# Patient Record
Sex: Male | Born: 1955 | Race: White | Hispanic: No | Smoking: Former smoker
Health system: Southern US, Community
[De-identification: ages and names within clinical notes are randomized; demographics above are authoritative.]

## PROBLEM LIST (undated history)

## (undated) DIAGNOSIS — F39 Unspecified mood [affective] disorder: Secondary | ICD-10-CM

## (undated) DIAGNOSIS — R011 Cardiac murmur, unspecified: Secondary | ICD-10-CM

## (undated) DIAGNOSIS — F0280 Dementia in other diseases classified elsewhere without behavioral disturbance: Secondary | ICD-10-CM

## (undated) DIAGNOSIS — E039 Hypothyroidism, unspecified: Secondary | ICD-10-CM

## (undated) DIAGNOSIS — G3109 Other frontotemporal dementia: Secondary | ICD-10-CM

## (undated) HISTORY — DX: Unspecified mood (affective) disorder: F39

## (undated) HISTORY — DX: Cardiac murmur, unspecified: R01.1

## (undated) HISTORY — DX: Hypothyroidism, unspecified: E03.9

## (undated) HISTORY — DX: Dementia in other diseases classified elsewhere, unspecified severity, without behavioral disturbance, psychotic disturbance, mood disturbance, and anxiety: F02.80

## (undated) HISTORY — DX: Other frontotemporal dementia: G31.09

---

## 2000-11-19 ENCOUNTER — Encounter: Payer: Self-pay | Admitting: *Deleted

## 2000-11-19 ENCOUNTER — Encounter: Admission: RE | Admit: 2000-11-19 | Discharge: 2000-11-19 | Payer: Self-pay | Admitting: *Deleted

## 2000-12-06 ENCOUNTER — Encounter: Payer: Self-pay | Admitting: *Deleted

## 2000-12-06 ENCOUNTER — Encounter: Admission: RE | Admit: 2000-12-06 | Discharge: 2000-12-06 | Payer: Self-pay | Admitting: *Deleted

## 2005-09-27 ENCOUNTER — Emergency Department: Payer: Self-pay | Admitting: Emergency Medicine

## 2012-06-19 ENCOUNTER — Ambulatory Visit: Payer: Self-pay | Admitting: Adult Health

## 2013-09-21 ENCOUNTER — Ambulatory Visit: Payer: Self-pay | Admitting: Family Medicine

## 2013-09-26 ENCOUNTER — Ambulatory Visit (INDEPENDENT_AMBULATORY_CARE_PROVIDER_SITE_OTHER): Payer: Medicaid Other | Admitting: Cardiovascular Disease

## 2013-09-26 ENCOUNTER — Encounter: Payer: Self-pay | Admitting: Cardiovascular Disease

## 2013-09-26 VITALS — BP 132/92 | HR 65 | Ht 68.0 in | Wt 170.5 lb

## 2013-09-26 DIAGNOSIS — F0280 Dementia in other diseases classified elsewhere without behavioral disturbance: Secondary | ICD-10-CM | POA: Insufficient documentation

## 2013-09-26 DIAGNOSIS — R011 Cardiac murmur, unspecified: Secondary | ICD-10-CM | POA: Insufficient documentation

## 2013-09-26 DIAGNOSIS — E785 Hyperlipidemia, unspecified: Secondary | ICD-10-CM | POA: Insufficient documentation

## 2013-09-26 DIAGNOSIS — E039 Hypothyroidism, unspecified: Secondary | ICD-10-CM | POA: Insufficient documentation

## 2013-09-26 MED ORDER — PRAVASTATIN SODIUM 20 MG PO TABS: 20.0000 mg | ORAL_TABLET | Freq: Every evening | ORAL | Status: AC

## 2013-09-26 NOTE — Assessment & Plan Note (Signed)
Recent progression, followed at [facility] neurology

## 2013-09-26 NOTE — Assessment & Plan Note (Signed)
On clinical exam today, possible innocent flow murmur, I/VI. We did offer echocardiogram. Discussed this with the family and they prefer that if it is not additionally clinically significant, that they hold off on further testing. We have suggested if he has progression of the intensity of his murmur, worsening shortness of breath or edema or new cardiac symptoms, but they call he office and we will perform an echocardiogram.

## 2013-09-26 NOTE — Progress Notes (Signed)
   Patient ID: Luke Mason, male    DOB: 10-Dec-1955, 57 y.o.   MRN: [id number]  HPI Comments: [name] is a very pleasant 57 year old gentleman, patient of Dr. Marguerite Olea, who presents by referral for heart murmur. Notes provided a primary care detail a history of frontotemporal lobe dementia, depression. He is followed at [facility] neurology. He can no longer live alone. History of hypothyroidism.  He reports that he is very active, goes to the gym and works out on a regular basis. He denies any shortness of breath or chest pain with exertion. No lower extremity edema, no lightheadedness or near syncope.  He does report a significant family history of heart disease. Father died at age 67 from heart attack, grandfather died at age 16 from heart attack.  He does have a smoking history, smoked for 25 years, quit 5-6 years ago. He smoked one to 2 packs of cigarettes per day.  EKG shows normal sinus rhythm with rate 65 beats per minute, no significant ST or T wave changes     Outpatient Encounter Prescriptions as of 09/26/2013  Medication Sig Dispense Refill  . aspirin 81 MG tablet Take 81 mg by mouth daily.      . cholecalciferol (VITAMIN D) 1000 UNITS tablet Take 1,000 Units by mouth daily.      Marland Kitchen levothyroxine (SYNTHROID, LEVOTHROID) 25 MCG tablet Take 25 mcg by mouth daily before breakfast.      . Multiple Vitamin (MULTIVITAMIN) tablet Take 1 tablet by mouth daily.      . sertraline (ZOLOFT) 100 MG tablet Take 100 mg by mouth daily.         Review of Systems  Constitutional: Negative.   HENT: Negative.   Eyes: Negative.   Respiratory: Negative.   Cardiovascular: Negative.   Gastrointestinal: Negative.   Endocrine: Negative.   Musculoskeletal: Negative.   Skin: Negative.   Allergic/Immunologic: Negative.   Neurological: Negative.   Hematological: Negative.   Psychiatric/Behavioral: Negative.   All other systems reviewed and are negative.    BP 132/92  Pulse 65  Ht 5\' 8"  (1.727  m)  Wt 170 lb 8 oz (77.338 kg)  BMI 25.93 kg/m2  Physical Exam  Nursing note and vitals reviewed. Constitutional: He is oriented to person, place, and time. He appears well-developed and well-nourished.  HENT:  Head: Normocephalic.  Nose: Nose normal.  Mouth/Throat: Oropharynx is clear and moist.  Eyes: Conjunctivae are normal. Pupils are equal, round, and reactive to light.  Neck: Normal range of motion. Neck supple. No JVD present.  Cardiovascular: Normal rate, regular rhythm, S1 normal, S2 normal, normal heart sounds and intact distal pulses.  Exam reveals no gallop and no friction rub.   No murmur heard. Pulmonary/Chest: Effort normal and breath sounds normal. No respiratory distress. He has no wheezes. He has no rales. He exhibits no tenderness.  Abdominal: Soft. Bowel sounds are normal. He exhibits no distension. There is no tenderness.  Musculoskeletal: Normal range of motion. He exhibits no edema and no tenderness.  Lymphadenopathy:    He has no cervical adenopathy.  Neurological: He is alert and oriented to person, place, and time. Coordination normal.  Skin: Skin is warm and dry. No rash noted. No erythema.  Psychiatric: He has a normal mood and affect. His behavior is normal. Judgment and thought content normal.      Assessment and Plan

## 2013-09-26 NOTE — Assessment & Plan Note (Signed)
Currently on thyroid supplementation medication

## 2013-09-26 NOTE — Patient Instructions (Signed)
You are doing well. Please start pravastatin 1/2 a pill once a day for a few weeks If you have no side effects, increase to a  full pill  Please call us if you have new issues that need to be addressed before your next appt.  Your physician wants you to follow-up in:12 months.

## 2013-09-26 NOTE — Assessment & Plan Note (Signed)
We spent significant time talking about cholesterol. He does have a significant smoking history, very strong family history. In discussion with the patient and his family, they would prefer to treat this aggressively. We will start low-dose pravastatin 10 mg for several weeks titrating up to 20 mg.

## 2014-08-22 ENCOUNTER — Telehealth: Payer: Self-pay | Admitting: *Deleted

## 2014-08-22 NOTE — Telephone Encounter (Signed)
Pt's sister calling because she got your name from her support group of patient's that you have already treated with FTD(frontal lobe dementia?) Luke Mason & [name] you treated before, sister would like to switch pt to you because of their recommendations. Sister states pt has BCBS and that he's declining. Pt currently seeing Dr. Marguerite Olea and [facility] neurology, she would like to know your thoughts and if you would accept her brother as a patient, she has HCPOA. Please advise

## 2014-08-24 NOTE — Telephone Encounter (Signed)
Message left Will try back next week

## 2014-08-27 NOTE — Telephone Encounter (Signed)
Message left again Will print her info and try to reach her intermittently Told her to leave me another number if that would help

## 2014-09-03 ENCOUNTER — Encounter: Payer: Self-pay | Admitting: Internal Medicine

## 2014-09-03 ENCOUNTER — Ambulatory Visit (INDEPENDENT_AMBULATORY_CARE_PROVIDER_SITE_OTHER): Payer: BLUE CROSS/BLUE SHIELD | Admitting: Internal Medicine

## 2014-09-03 VITALS — BP 138/82 | HR 63 | Temp 98.7°F | Resp 14 | Wt 179.8 lb

## 2014-09-03 DIAGNOSIS — G3109 Other frontotemporal dementia: Secondary | ICD-10-CM

## 2014-09-03 DIAGNOSIS — E038 Other specified hypothyroidism: Secondary | ICD-10-CM

## 2014-09-03 DIAGNOSIS — E785 Hyperlipidemia, unspecified: Secondary | ICD-10-CM

## 2014-09-03 DIAGNOSIS — F39 Unspecified mood [affective] disorder: Secondary | ICD-10-CM

## 2014-09-03 DIAGNOSIS — F0280 Dementia in other diseases classified elsewhere without behavioral disturbance: Secondary | ICD-10-CM

## 2014-09-03 NOTE — Assessment & Plan Note (Signed)
Rx not appropriate Will get baseline levels

## 2014-09-03 NOTE — Progress Notes (Signed)
Pre visit review using our clinic review tool, if applicable. No additional management support is needed unless otherwise documented below in the visit note. 

## 2014-09-03 NOTE — Assessment & Plan Note (Signed)
Moderate Needs help with ADLs Has adult day care and sister has some respite---may have issues as he worsens

## 2014-09-03 NOTE — Assessment & Plan Note (Signed)
Doing okay for now on the sertraline No changes

## 2014-09-03 NOTE — Assessment & Plan Note (Signed)
On replacement Due for labs

## 2014-09-03 NOTE — Progress Notes (Signed)
Subjective:    Patient ID: Luke Mason, male    DOB: 01-25-1956, 58 y.o.   MRN: [id number]  HPI Here with sister---he lives with her He has lived with her for 3 years Establishing here  Has frontal lobe dementia [facility]-- first diagnosed 9/13 He doesn't notice any problems No particular anger issues Needs cueing for all ADLs Wears diaper for incontinence No elopement issues [facility] while sister is working She gets occasional respite from aunt who will watch him for a weekend (while she visits grandkids in [place])  Does have some depressed mood Still gets emotional but this seems to help He states he is satisfied  Has been on low dose thyroid med At least 3 years  History of high cholesterol Never been on meds for this  Current Outpatient Prescriptions on File Prior to Visit  Medication Sig Dispense Refill  . aspirin 81 MG tablet Take 81 mg by mouth daily.      Marland Kitchen. levothyroxine (SYNTHROID, LEVOTHROID) 25 MCG tablet Take 25 mcg by mouth daily before breakfast.      . Multiple Vitamin (MULTIVITAMIN) tablet Take 1 tablet by mouth daily.      . sertraline (ZOLOFT) 100 MG tablet Take 100 mg by mouth daily.      . cholecalciferol (VITAMIN D) 1000 UNITS tablet Take 1,000 Units by mouth daily.      . pravastatin (PRAVACHOL) 20 MG tablet Take 1 tablet (20 mg total) by mouth every evening.  90 tablet  3   No current facility-administered medications on file prior to visit.    No Known Allergies  Past Medical History  Diagnosis Date  . Unspecified hypothyroidism   . Episodic mood disorder   . Heart murmur   . Dementia, frontotemporal     lobe    No past surgical history on file.  Family History  Problem Relation Age of Onset  . Heart disease Father   . Hypertension Father   . Hyperlipidemia Father   . Heart attack Father     History   Social History  . Marital Status: Divorced    Spouse Name: N/A    Number of Children: 0   . Years of Education: N/A   Occupational History  . Systems developernsurance sales--- disabled    Social History Main Topics  . Smoking status: Former Smoker    Types: Cigarettes    Quit date: 09/26/2007  . Smokeless tobacco: Not on file  . Alcohol Use: Yes     Comment: occasional beer.  . Drug Use: No  . Sexual Activity: Not on file   Other Topics Concern  . Not on file   Social History Narrative   Lives with sister since 2012      No living will    Requests sister to be health care POA   Would accept resuscitation   Review of Systems  Constitutional: Negative for fatigue and unexpected weight change.  HENT: Negative for dental problem and hearing loss.   Eyes: Negative for visual disturbance.       Reading glasses  Respiratory: Negative for cough, chest tightness and shortness of breath.   Cardiovascular: Negative for chest pain, palpitations and leg swelling.  Gastrointestinal: Negative for nausea, abdominal pain, diarrhea and constipation.  Genitourinary: Negative for dysuria, urgency and difficulty urinating.  Musculoskeletal: Negative for arthralgias, back pain and joint swelling.  Skin: Negative for rash.  Neurological: Positive for headaches. Negative for dizziness, syncope and  light-headedness.       Occasional frontal headaches-- tylenol usually helps  Psychiatric/Behavioral: Positive for dysphoric mood. Negative for sleep disturbance. The patient is nervous/anxious.        Objective:   Physical Exam  Constitutional: He appears well-developed and well-nourished. No distress.  Neck: Normal range of motion. Neck supple. No thyromegaly present.  Cardiovascular: Normal rate, regular rhythm, normal heart sounds and intact distal pulses.  Exam reveals no gallop.   No murmur heard. Pulmonary/Chest: Effort normal and breath sounds normal. No respiratory distress. He has no wheezes. He has no rales.  Abdominal: Soft. There is no tenderness.  Musculoskeletal: He exhibits no edema  and no tenderness.  Lymphadenopathy:    He has no cervical adenopathy.  Neurological: He exhibits normal muscle tone. Coordination normal.  Doesn't understand directions (couldn't figure out what I meant by "take off your socks" Answered "no" to all ROS   Skin: No rash noted.  Psychiatric: He has a normal mood and affect. His behavior is normal.          Assessment & Plan:

## 2014-09-04 LAB — CBC WITH DIFFERENTIAL/PLATELET
BASOS ABS: 0.1 10*3/uL (ref 0.0–0.1)
Basophils Relative: 0.9 % (ref 0.0–3.0)
Eosinophils Absolute: 0.1 10*3/uL (ref 0.0–0.7)
Eosinophils Relative: 1 % (ref 0.0–5.0)
HCT: 41.1 % (ref 39.0–52.0)
Hemoglobin: 14.2 g/dL (ref 13.0–17.0)
LYMPHS PCT: 22.2 % (ref 12.0–46.0)
Lymphs Abs: 1.6 10*3/uL (ref 0.7–4.0)
MCHC: 34.6 g/dL (ref 30.0–36.0)
MCV: 95.4 fl (ref 78.0–100.0)
MONOS PCT: 8.9 % (ref 3.0–12.0)
Monocytes Absolute: 0.6 10*3/uL (ref 0.1–1.0)
Neutro Abs: 4.8 10*3/uL (ref 1.4–7.7)
Neutrophils Relative %: 67 % (ref 43.0–77.0)
Platelets: 241 10*3/uL (ref 150.0–400.0)
RBC: 4.31 Mil/uL (ref 4.22–5.81)
RDW: 12.6 % (ref 11.5–15.5)
WBC: 7.2 10*3/uL (ref 4.0–10.5)

## 2014-09-04 LAB — COMPREHENSIVE METABOLIC PANEL
ALT: 11 U/L (ref 0–53)
AST: 19 U/L (ref 0–37)
Albumin: 4.5 g/dL (ref 3.5–5.2)
Alkaline Phosphatase: 56 U/L (ref 39–117)
BUN: 11 mg/dL (ref 6–23)
CALCIUM: 9.6 mg/dL (ref 8.4–10.5)
CHLORIDE: 102 meq/L (ref 96–112)
CO2: 31 meq/L (ref 19–32)
CREATININE: 1 mg/dL (ref 0.4–1.5)
GFR: 84.23 mL/min (ref >60.00)
Glucose, Bld: 88 mg/dL (ref 70–99)
Potassium: 4.5 mEq/L (ref 3.5–5.1)
Sodium: 139 mEq/L (ref 135–145)
Total Bilirubin: 0.6 mg/dL (ref 0.2–1.2)
Total Protein: 7.5 g/dL (ref 6.0–8.3)

## 2014-09-04 LAB — LIPID PANEL
CHOL/HDL RATIO: 5
CHOLESTEROL: 216 mg/dL — AB (ref 0–200)
HDL: 44.8 mg/dL (ref >39.00)
LDL Cholesterol: 136 mg/dL — ABNORMAL HIGH (ref 0–99)
NonHDL: 171.2
TRIGLYCERIDES: 176 mg/dL — AB (ref 0.0–149.0)
VLDL: 35.2 mg/dL (ref 0.0–40.0)

## 2014-09-04 LAB — TSH: TSH: 2.46 u[IU]/mL (ref 0.35–4.50)

## 2014-09-04 LAB — T4, FREE: Free T4: 0.85 ng/dL (ref 0.60–1.60)

## 2015-02-25 ENCOUNTER — Telehealth: Payer: Self-pay | Admitting: Internal Medicine

## 2015-02-25 ENCOUNTER — Encounter: Payer: Self-pay | Admitting: Internal Medicine

## 2015-02-25 NOTE — Telephone Encounter (Signed)
Patient Name: Luke Mason DOB: 01-30-1956 Initial Comment Caller states brother has shortness of breath and really hot and flushed/. Nurse Assessment Nurse: Charna Elizabethrumbull, RN, Cathy Date/Time (Eastern Time): 02/25/2015 2:44:19 PM Confirm and document reason for call. If symptomatic, describe symptoms. ---Caller states her brother has shortness of breath today. No blueness around his lips. Has the patient traveled out of the country within the last 30 days? ---No Does the patient require triage? ---Yes Related visit to physician within the last 2 weeks? ---No Does the PT have any chronic conditions? (i.e. diabetes, asthma, etc.) ---Yes List chronic conditions. ---Dementia Guidelines Guideline Title Affirmed Question Affirmed Notes Breathing Difficulty [1] MODERATE difficulty breathing (e.g., speaks in phrases, SOB even at rest, pulse 100-120) AND [2] NEW-onset or WORSE than normal Final Disposition User Go to ED Now Charna Elizabethrumbull, RN, Lynden AngCathy They plan to go [facility].

## 2015-02-25 NOTE — Telephone Encounter (Signed)
Spoke to sister Not sure about the blood sugar notes--that is not correct  Obviously communication with him can be difficult Has some vague SOB She doesn't feel it warrants ER eval  Will see tomorrow  Lyla SonCarrie, Please add him on at 12:15PM Tuesday (3/29)

## 2015-02-25 NOTE — Telephone Encounter (Signed)
PLEASE NOTE: All timestamps contained within this report are represented as Eastern Standard Time. CONFIDENTIALTY NOTICE: This fax transmission is intended only for the addressee. It contains information that is legally privileged, confidential or otherwise protected from use or disclosure. If you are not the intended recipient, you are strictly prohibited from reviewing, disclosing, copying using or disseminating any of this information or taking any action in reliance on or regarding this information. If you have received this fax in error, please notify us immediately by telephone so that we can arrange for its return to us. Phone: [phone], Toll-Free: [phone], Fax: [phone] Page: 1 of 2 Call Id: [id number] [facility] - Client TELEPHONE ADVICE RECORD [facility] Patient Name: Luke Mason Gender: Male DOB: 08/16/1926 Age: 59 Y 6 M 11 D Return Phone Number: [phone] (Primary) Address: [address] City/State/Zip: [place] [address] Client [facility] - Client Client Site [facility] - Day Physician Bedsole, Amy Contact Type Call Call Type Triage / Clinical Relationship To Patient Self Appointment Disposition EMR Appointment Not Necessary Info pasted into Epic Yes Return Phone Number [phone] (Primary) Chief Complaint Blood Sugar High Initial Comment Caller states BS is 297, fatigue, on metformin; PreDisposition Call Doctor Nurse Assessment Nurse: Jones, RN, Miranda Date/Time (Eastern Time): 02/25/2015 10:02:02 AM Confirm and document reason for call. If symptomatic, describe symptoms. ---Caller states her BS is 297 (normally < 150) and having fatigue. Has the patient traveled out of the country within the last 30 days? ---Not Applicable Does the patient require triage? ---Yes Related visit to physician within the last 2 weeks? ---No Does the PT have any  chronic conditions? (i.e. diabetes, asthma, etc.) ---Yes List chronic conditions. ---Diabetes, HTN, High Cholesterol Guidelines Guideline Title Affirmed Question Affirmed Notes Nurse Date/Time (Eastern Time) Diabetes - High Blood Sugar [1] Blood glucose > 240 mg/dl (13 mmol/l) AND [2] does not use insulin (e.g., not insulindependent; most type 2 diabetics) (all triage questions negative) Jones, RN, Miranda 02/25/2015 10:04:31 AM Disp. Time (Eastern Time) Disposition Final User 02/25/2015 10:08:45 AM Home Care Yes Jones, RN, Miranda Caller Understands: Yes PLEASE NOTE: All timestamps contained within this report are represented as Eastern Standard Time. CONFIDENTIALTY NOTICE: This fax transmission is intended only for the addressee. It contains information that is legally privileged, confidential or otherwise protected from use or disclosure. If you are not the intended recipient, you are strictly prohibited from reviewing, disclosing, copying using or disseminating any of this information or taking any action in reliance on or regarding this information. If you have received this fax in error, please notify us immediately by telephone so that we can arrange for its return to us. Phone: [phone], Toll-Free: [phone], Fax: [phone] Page: 2 of 2 Call Id: [id number] Disagree/Comply: Comply Care Advice Given Per Guideline HOME CARE: You should be able to treat this at home. HIGH BLOOD SUGAR (HYPERGLYCEMIA): TREATMENT - LIQUIDS: * Generally, you should try to drink 6-8 glasses of water each day. * Drink at least one glass (8 oz) of water per hour for the next 4 hours (Reason: adequate hydration will reduce hyperglycemia). TREATMENT - DIABETES MEDICATIONS: Continue taking your diabetes pills.. EXPECTED COURSE - You should CALL YOUR DOCTOR WITHIN 1-3 DAYS if: * Your blood sugar continues to get above 240 mg/dl (13 mmol/l). MEASURE AND RECORD YOUR BLOOD GLUCOSE: * Measure your blood  glucose before breakfast and before going to bed. CALL BACK IF: * Blood   glucose over 300 mg/dL (16.5 mmol/l), two or more times in a row. * Urine ketones become moderate or large * Vomiting lasting over 4 hours or unable to drink any fluids * You become worse or have more questions. CARE ADVICE given per Diabetes - High Blood Sugar (Adult) guideline. * Your blood sugar continues to be higher than the glucose goals your doctor set for you.It has been longer than 6 months since you had an Hemoglobin A1C test. * Record the results and show them to your doctor at your next office visit. After Care Instructions Given Call Event Type User Date / Time Description 

## 2015-02-26 ENCOUNTER — Encounter: Payer: Self-pay | Admitting: Internal Medicine

## 2015-02-26 ENCOUNTER — Ambulatory Visit (INDEPENDENT_AMBULATORY_CARE_PROVIDER_SITE_OTHER): Payer: PPO | Admitting: Internal Medicine

## 2015-02-26 VITALS — BP 132/94 | HR 66 | Temp 98.8°F | Resp 12 | Wt 171.2 lb

## 2015-02-26 DIAGNOSIS — E039 Hypothyroidism, unspecified: Secondary | ICD-10-CM | POA: Diagnosis not present

## 2015-02-26 DIAGNOSIS — G3109 Other frontotemporal dementia: Secondary | ICD-10-CM

## 2015-02-26 DIAGNOSIS — E785 Hyperlipidemia, unspecified: Secondary | ICD-10-CM

## 2015-02-26 DIAGNOSIS — R0609 Other forms of dyspnea: Secondary | ICD-10-CM | POA: Diagnosis not present

## 2015-02-26 DIAGNOSIS — F0280 Dementia in other diseases classified elsewhere without behavioral disturbance: Secondary | ICD-10-CM

## 2015-02-26 LAB — LIPID PANEL
Cholesterol: 199 mg/dL (ref 0–200)
HDL: 44.6 mg/dL (ref >39.00)
LDL Cholesterol: 131 mg/dL — ABNORMAL HIGH (ref 0–99)
NonHDL: 154.4
Total CHOL/HDL Ratio: 4
Triglycerides: 119 mg/dL (ref 0.0–149.0)
VLDL: 23.8 mg/dL (ref 0.0–40.0)

## 2015-02-26 LAB — CBC WITH DIFFERENTIAL/PLATELET
BASOS PCT: 0.3 % (ref 0.0–3.0)
Basophils Absolute: 0 10*3/uL (ref 0.0–0.1)
EOS ABS: 0 10*3/uL (ref 0.0–0.7)
Eosinophils Relative: 0.2 % (ref 0.0–5.0)
HCT: 40.7 % (ref 39.0–52.0)
Hemoglobin: 14 g/dL (ref 13.0–17.0)
LYMPHS ABS: 1.3 10*3/uL (ref 0.7–4.0)
LYMPHS PCT: 16.8 % (ref 12.0–46.0)
MCHC: 34.3 g/dL (ref 30.0–36.0)
MCV: 95 fl (ref 78.0–100.0)
MONOS PCT: 6.4 % (ref 3.0–12.0)
Monocytes Absolute: 0.5 10*3/uL (ref 0.1–1.0)
NEUTROS PCT: 76.3 % (ref 43.0–77.0)
Neutro Abs: 5.8 10*3/uL (ref 1.4–7.7)
Platelets: 216 10*3/uL (ref 150.0–400.0)
RBC: 4.28 Mil/uL (ref 4.22–5.81)
RDW: 12.9 % (ref 11.5–15.5)
WBC: 7.6 10*3/uL (ref 4.0–10.5)

## 2015-02-26 LAB — COMPREHENSIVE METABOLIC PANEL
ALK PHOS: 60 U/L (ref 39–117)
ALT: 14 U/L (ref 0–53)
AST: 14 U/L (ref 0–37)
Albumin: 4.3 g/dL (ref 3.5–5.2)
BILIRUBIN TOTAL: 0.6 mg/dL (ref 0.2–1.2)
BUN: 12 mg/dL (ref 6–23)
CALCIUM: 9.9 mg/dL (ref 8.4–10.5)
CHLORIDE: 101 meq/L (ref 96–112)
CO2: 32 mEq/L (ref 19–32)
Creatinine, Ser: 1.02 mg/dL (ref 0.40–1.50)
GFR: 79.35 mL/min (ref >60.00)
Glucose, Bld: 122 mg/dL — ABNORMAL HIGH (ref 70–99)
Potassium: 3.7 mEq/L (ref 3.5–5.1)
SODIUM: 138 meq/L (ref 135–145)
Total Protein: 7.1 g/dL (ref 6.0–8.3)

## 2015-02-26 LAB — TSH: TSH: 1.57 u[IU]/mL (ref 0.35–4.50)

## 2015-02-26 LAB — T4, FREE: Free T4: 0.82 ng/dL (ref 0.60–1.60)

## 2015-02-26 NOTE — Assessment & Plan Note (Signed)
Will recheck labs given his current symptoms

## 2015-02-26 NOTE — Assessment & Plan Note (Signed)
Continues on the statin May want to stop this over time as his condition progresses

## 2015-02-26 NOTE — Telephone Encounter (Signed)
I added patient on at 12:15.

## 2015-02-26 NOTE — Assessment & Plan Note (Signed)
New finding in past few days No symptoms of infection and exam normal No CHF No clear explanation Will just check labs for now No clear indication that the prolixin could be implicated with this

## 2015-02-26 NOTE — Progress Notes (Signed)
Subjective:    Patient ID: Luke Mason, male    DOB: [birth year], 59 y.o.   MRN: [id number]  HPI Here with sister  He has noticed some SOB with activity--even just walking around house No problems just sitting This started a few days ago No chest pain No apparent dizziness or syncope  No fever Doesn't appear to have respiratory illness  Has been sleeping more Appetite is down some--- but still is okay Sleeps on his side curled up---no orthopnea or PND  Started on prolixin from [facility] [employer] PA at neurology Started about 2 months ago For anger and physical aggression This has helped some Reviewed her last note on Care Everywhere  Current Outpatient Prescriptions on File Prior to Visit  Medication Sig Dispense Refill  . Ascorbic Acid (VITAMIN C) 1000 MG tablet Take 1,000 mg by mouth daily.    Marland Kitchen aspirin 81 MG tablet Take 81 mg by mouth daily.    . cholecalciferol (VITAMIN D) 1000 UNITS tablet Take 1,000 Units by mouth daily.    Marland Kitchen levothyroxine (SYNTHROID, LEVOTHROID) 25 MCG tablet Take 25 mcg by mouth daily before breakfast.    . Multiple Vitamin (MULTIVITAMIN) tablet Take 1 tablet by mouth daily.    . pravastatin (PRAVACHOL) 20 MG tablet Take 1 tablet (20 mg total) by mouth every evening. 90 tablet 3  . sertraline (ZOLOFT) 100 MG tablet Take 100 mg by mouth daily.     No current facility-administered medications on file prior to visit.    No Known Allergies  Past Medical History  Diagnosis Date  . Unspecified hypothyroidism   . Episodic mood disorder   . Heart murmur   . Dementia, frontotemporal     lobe    No past surgical history on file.  Family History  Problem Relation Age of Onset  . Heart disease Father   . Hypertension Father   . Hyperlipidemia Father   . Heart attack Father     History   Social History  . Marital Status: Divorced    Spouse Name: N/A  . Number of Children: 0  . Years of Education: N/A   Occupational History  .  Systems developer--- disabled    Social History Main Topics  . Smoking status: Former Smoker    Types: Cigarettes    Quit date: 09/26/2007  . Smokeless tobacco: Not on file  . Alcohol Use: Yes     Comment: occasional beer.  . Drug Use: No  . Sexual Activity: Not on file   Other Topics Concern  . Not on file   Social History Narrative   Lives with sister since 2012      No living will    Requests sister to be health care POA   Would accept resuscitation   Review of Systems Has had some headaches No significant ankle edema--may have slight indentation from socks which is not new May have a little more trouble with swallowing No history of spring allergies    Objective:   Physical Exam  Constitutional: He appears well-developed and well-nourished. No distress.  Neck: Normal range of motion. Neck supple. No thyromegaly present.  Cardiovascular: Normal rate, regular rhythm, normal heart sounds and intact distal pulses.  Exam reveals no gallop.   No murmur heard. Pulmonary/Chest: Effort normal and breath sounds normal. No respiratory distress. He has no wheezes. He has no rales.  Abdominal: Soft. There is no tenderness.  No HSM  Musculoskeletal: He exhibits no edema or tenderness.  Lymphadenopathy:    He has no cervical adenopathy.  Psychiatric:  Mumbled speech Follows commands Cooperative here          Assessment & Plan:

## 2015-02-26 NOTE — Progress Notes (Signed)
Pre visit review using our clinic review tool, if applicable. No additional management support is needed unless otherwise documented below in the visit note. 

## 2015-02-26 NOTE — Assessment & Plan Note (Signed)
[facility] Agitation and psychosis some better with the prolixin

## 2015-03-03 ENCOUNTER — Encounter: Payer: Self-pay | Admitting: Internal Medicine

## 2015-03-05 ENCOUNTER — Ambulatory Visit: Payer: Medicaid Other | Admitting: Internal Medicine

## 2015-04-05 ENCOUNTER — Telehealth: Payer: Self-pay | Admitting: Internal Medicine

## 2015-04-05 NOTE — Telephone Encounter (Signed)
Luke Mason dropped off form for adult daycare She would like it mailed. Attached is a self address stamp envelope IN dr [place]ass[place] In box

## 2015-04-08 DIAGNOSIS — Z7689 Persons encountering health services in other specified circumstances: Secondary | ICD-10-CM

## 2015-04-08 NOTE — Telephone Encounter (Signed)
Form done Can just bill her the $20--okay to mail form

## 2015-04-08 NOTE — Telephone Encounter (Signed)
Form mailed to [facility].

## 2015-04-09 ENCOUNTER — Telehealth: Payer: Self-pay | Admitting: Internal Medicine

## 2015-04-09 NOTE — Telephone Encounter (Signed)
Left message asking sherri to call office See below message

## 2015-04-09 NOTE — Telephone Encounter (Signed)
Please schedule

## 2015-04-09 NOTE — Telephone Encounter (Signed)
Appointment 5/17  Luke Mason aware

## 2015-04-09 NOTE — Telephone Encounter (Signed)
Sherrie called wanting to get mr Luke AlpersSawyer a tb test for [facility] Is it ok to schedule

## 2015-04-15 ENCOUNTER — Telehealth: Payer: Self-pay | Admitting: Internal Medicine

## 2015-04-15 DIAGNOSIS — G3109 Other frontotemporal dementia: Principal | ICD-10-CM

## 2015-04-15 DIAGNOSIS — F0280 Dementia in other diseases classified elsewhere without behavioral disturbance: Secondary | ICD-10-CM

## 2015-04-15 NOTE — Telephone Encounter (Signed)
Luke Mason called she would like to get an order to get in home health care for [facility]

## 2015-04-15 NOTE — Telephone Encounter (Signed)
Find out what she is looking for---what type of home health. If it is aides, I don't think any insurance will cover that except immediately after surgery, etc

## 2015-04-15 NOTE — Telephone Encounter (Signed)
Spoke with wife, and she states pt is being dismissed from the adult day care that he was attending. She is trying to get him into skilled nursing faciltiy somewhere and until then, she states her insurance will pay for an aide to help with care, feeding, bathing and general care per wife.

## 2015-04-15 NOTE — Telephone Encounter (Signed)
It is his sister Please see if we can get him set up

## 2015-04-16 ENCOUNTER — Ambulatory Visit (INDEPENDENT_AMBULATORY_CARE_PROVIDER_SITE_OTHER): Payer: PPO | Admitting: *Deleted

## 2015-04-16 ENCOUNTER — Emergency Department: Payer: PPO

## 2015-04-16 ENCOUNTER — Emergency Department
Admission: EM | Admit: 2015-04-16 | Discharge: 2015-04-17 | Disposition: A | Discharge status: Home / Self Care | Payer: PPO | Attending: Emergency Medicine | Admitting: Emergency Medicine

## 2015-04-16 ENCOUNTER — Other Ambulatory Visit: Payer: Self-pay

## 2015-04-16 DIAGNOSIS — S01111A Laceration without foreign body of right eyelid and periocular area, initial encounter: Secondary | ICD-10-CM | POA: Insufficient documentation

## 2015-04-16 DIAGNOSIS — Y92410 Unspecified street and highway as the place of occurrence of the external cause: Secondary | ICD-10-CM | POA: Diagnosis not present

## 2015-04-16 DIAGNOSIS — S0181XA Laceration without foreign body of other part of head, initial encounter: Secondary | ICD-10-CM

## 2015-04-16 DIAGNOSIS — Y9389 Activity, other specified: Secondary | ICD-10-CM | POA: Insufficient documentation

## 2015-04-16 DIAGNOSIS — Z87891 Personal history of nicotine dependence: Secondary | ICD-10-CM | POA: Insufficient documentation

## 2015-04-16 DIAGNOSIS — S40211A Abrasion of right shoulder, initial encounter: Secondary | ICD-10-CM | POA: Insufficient documentation

## 2015-04-16 DIAGNOSIS — Y998 Other external cause status: Secondary | ICD-10-CM | POA: Insufficient documentation

## 2015-04-16 DIAGNOSIS — W1839XA Other fall on same level, initial encounter: Secondary | ICD-10-CM | POA: Insufficient documentation

## 2015-04-16 DIAGNOSIS — Z111 Encounter for screening for respiratory tuberculosis: Secondary | ICD-10-CM

## 2015-04-16 DIAGNOSIS — Z79899 Other long term (current) drug therapy: Secondary | ICD-10-CM | POA: Insufficient documentation

## 2015-04-16 DIAGNOSIS — S0083XA Contusion of other part of head, initial encounter: Secondary | ICD-10-CM

## 2015-04-16 LAB — CBC WITH DIFFERENTIAL/PLATELET
Basophils Absolute: 0 10*3/uL (ref 0–0.1)
Basophils Relative: 0 %
EOS ABS: 0.1 10*3/uL (ref 0–0.7)
EOS PCT: 1 %
HCT: 32.8 % — ABNORMAL LOW (ref 40.0–52.0)
HEMOGLOBIN: 11.8 g/dL — AB (ref 13.0–18.0)
LYMPHS ABS: 1.4 10*3/uL (ref 1.0–3.6)
LYMPHS PCT: 22 %
MCH: 33.8 pg (ref 26.0–34.0)
MCHC: 35.9 g/dL (ref 32.0–36.0)
MCV: 94.2 fL (ref 80.0–100.0)
Monocytes Absolute: 0.6 10*3/uL (ref 0.2–1.0)
Monocytes Relative: 10 %
Neutro Abs: 4.3 10*3/uL (ref 1.4–6.5)
Neutrophils Relative %: 67 %
Platelets: 181 10*3/uL (ref 150–440)
RBC: 3.48 MIL/uL — ABNORMAL LOW (ref 4.40–5.90)
RDW: 13.1 % (ref 11.5–14.5)
WBC: 6.4 10*3/uL (ref 3.8–10.6)

## 2015-04-16 LAB — URINALYSIS COMPLETE WITH MICROSCOPIC (ARMC ONLY)
Bacteria, UA: NONE SEEN
Bilirubin Urine: NEGATIVE
GLUCOSE, UA: NEGATIVE mg/dL
Hgb urine dipstick: NEGATIVE
Ketones, ur: NEGATIVE mg/dL
LEUKOCYTES UA: NEGATIVE
Nitrite: NEGATIVE
PROTEIN: NEGATIVE mg/dL
Specific Gravity, Urine: 1.009 (ref 1.005–1.030)
Squamous Epithelial / LPF: NONE SEEN
pH: 8 (ref 5.0–8.0)

## 2015-04-16 LAB — BASIC METABOLIC PANEL
ANION GAP: 10 (ref 5–15)
BUN: 14 mg/dL (ref 6–20)
CO2: 29 mmol/L (ref 22–32)
Calcium: 8.9 mg/dL (ref 8.9–10.3)
Chloride: 100 mmol/L — ABNORMAL LOW (ref 101–111)
Creatinine, Ser: 0.89 mg/dL (ref 0.61–1.24)
GFR calc Af Amer: >60 mL/min (ref >60)
GFR calc non Af Amer: >60 mL/min (ref >60)
Glucose, Bld: 123 mg/dL — ABNORMAL HIGH (ref 65–99)
Potassium: 3.6 mmol/L (ref 3.5–5.1)
SODIUM: 139 mmol/L (ref 135–145)

## 2015-04-16 MED ORDER — LORAZEPAM 2 MG/ML IJ SOLN
INTRAMUSCULAR | Status: AC
  Administered 2015-04-16: 0.5 mg via INTRAVENOUS
  Filled 2015-04-16: qty 1

## 2015-04-16 MED ORDER — LORAZEPAM 2 MG/ML IJ SOLN
INTRAMUSCULAR | Status: AC
  Administered 2015-04-16: 1 mg via INTRAVENOUS
  Filled 2015-04-16: qty 1

## 2015-04-16 MED ORDER — LORAZEPAM 2 MG/ML IJ SOLN
1.0000 mg | Freq: Once | INTRAMUSCULAR | Status: AC
  Administered 2015-04-16: 1 mg via INTRAVENOUS

## 2015-04-16 MED ORDER — HALOPERIDOL LACTATE 5 MG/ML IJ SOLN
INTRAMUSCULAR | Status: AC
  Administered 2015-04-16: 2 mg via INTRAVENOUS
  Filled 2015-04-16: qty 1

## 2015-04-16 MED ORDER — LORAZEPAM 2 MG/ML IJ SOLN
INTRAMUSCULAR | Status: AC
  Filled 2015-04-16: qty 1

## 2015-04-16 MED ORDER — LORAZEPAM 2 MG/ML IJ SOLN
0.5000 mg | Freq: Once | INTRAMUSCULAR | Status: AC
  Administered 2015-04-16: 0.5 mg via INTRAVENOUS

## 2015-04-16 MED ORDER — HALOPERIDOL LACTATE 5 MG/ML IJ SOLN
2.0000 mg | Freq: Once | INTRAMUSCULAR | Status: AC
  Administered 2015-04-16: 2 mg via INTRAVENOUS

## 2015-04-16 MED ORDER — LORAZEPAM 2 MG/ML IJ SOLN
0.5000 mg | Freq: Once | INTRAMUSCULAR | Status: AC
Start: 2015-04-16 — End: 2015-04-16
  Administered 2015-04-16: 0.5 mg via INTRAVENOUS

## 2015-04-16 NOTE — ED Notes (Signed)
Call from Radiology patient is restless and not able to sit still in CT scanner.  Dr. Shaune PollackLord notified for Ativan order.  Went to CT scanner, patient already taken back to room 10 and had pulled IV out.  Non-occlusive net placed around new IV site.

## 2015-04-16 NOTE — ED Notes (Signed)
Patient transported to CT 

## 2015-04-16 NOTE — Discharge Instructions (Signed)
Return to the emergency department for any new or worsening symptoms including pain, weakness, numbness, altered mental status, or any yellow or pus drainage from the laceration. The skin glue in place until peels off on its own in about 7-10 days.  Facial or Scalp Contusion A facial or scalp contusion is a deep bruise on the face or head. Injuries to the face and head generally cause a lot of swelling, especially around the eyes. Contusions are the result of an injury that caused bleeding under the skin. The contusion may turn blue, purple, or yellow. Minor injuries will give you a painless contusion, but more severe contusions may stay painful and swollen for a few weeks.  CAUSES  A facial or scalp contusion is caused by a blunt injury or trauma to the face or head area.  SIGNS AND SYMPTOMS   Swelling of the injured area.   Discoloration of the injured area.   Tenderness, soreness, or pain in the injured area.  DIAGNOSIS  The diagnosis can be made by taking a medical history and doing a physical exam. An X-ray exam, CT scan, or MRI may be needed to determine if there are any associated injuries, such as broken bones (fractures). TREATMENT  Often, the best treatment for a facial or scalp contusion is applying cold compresses to the injured area. Over-the-counter medicines may also be recommended for pain control.  HOME CARE INSTRUCTIONS   Only take over-the-counter or prescription medicines as directed by your health care provider.   Apply ice to the injured area.   Put ice in a plastic bag.   Place a towel between your skin and the bag.   Leave the ice on for 20 minutes, 2-3 times a day.  SEEK MEDICAL CARE IF:  You have bite problems.   You have pain with chewing.   You are concerned about facial defects. SEEK IMMEDIATE MEDICAL CARE IF:  You have severe pain or a headache that is not relieved by medicine.   You have unusual sleepiness, confusion, or personality  changes.   You throw up (vomit).   You have a persistent nosebleed.   You have double vision or blurred vision.   You have fluid drainage from your nose or ear.   You have difficulty walking or using your arms or legs.  MAKE SURE YOU:   Understand these instructions.  Will watch your condition.  Will get help right away if you are not doing well or get worse. Document Released: 12/24/2004 Document Revised: 09/06/2013 Document Reviewed: 06/29/2013 [facility] Patient Information 2015 [place]. This information is not intended to replace advice given to you by your health care provider. Make sure you discuss any questions you have with your health care provider.  Facial Laceration  A facial laceration is a cut on the face. These injuries can be painful and cause bleeding. Lacerations usually heal quickly, but they need special care to reduce scarring. DIAGNOSIS  Your health care provider will take a medical history, ask for details about how the injury occurred, and examine the wound to determine how deep the cut is. TREATMENT  Some facial lacerations may not require closure. Others may not be able to be closed because of an increased risk of infection. The risk of infection and the chance for successful closure will depend on various factors, including the amount of time since the injury occurred. The wound may be cleaned to help prevent infection. If closure is appropriate, pain medicines may be given  if needed. Your health care provider will use stitches (sutures), wound glue (adhesive), or skin adhesive strips to repair the laceration. These tools bring the skin edges together to allow for faster healing and a better cosmetic outcome. If needed, you may also be given a tetanus shot. HOME CARE INSTRUCTIONS  Only take over-the-counter or prescription medicines as directed by your health care provider.  Follow your health care provider's instructions for wound care. These  instructions will vary depending on the technique used for closing the wound. For Sutures:  Keep the wound clean and dry.   If you were given a bandage (dressing), you should change it at least once a day. Also change the dressing if it becomes wet or dirty, or as directed by your health care provider.   Wash the wound with soap and water 2 times a day. Rinse the wound off with water to remove all soap. Pat the wound dry with a clean towel.   After cleaning, apply a thin layer of the antibiotic ointment recommended by your health care provider. This will help prevent infection and keep the dressing from sticking.   You may shower as usual after the first 24 hours. Do not soak the wound in water until the sutures are removed.   Get your sutures removed as directed by your health care provider. With facial lacerations, sutures should usually be taken out after 4-5 days to avoid stitch marks.   Wait a few days after your sutures are removed before applying any makeup. For Skin Adhesive Strips:  Keep the wound clean and dry.   Do not get the skin adhesive strips wet. You may bathe carefully, using caution to keep the wound dry.   If the wound gets wet, pat it dry with a clean towel.   Skin adhesive strips will fall off on their own. You may trim the strips as the wound heals. Do not remove skin adhesive strips that are still stuck to the wound. They will fall off in time.  For Wound Adhesive:  You may briefly wet your wound in the shower or bath. Do not soak or scrub the wound. Do not swim. Avoid periods of heavy sweating until the skin adhesive has fallen off on its own. After showering or bathing, gently pat the wound dry with a clean towel.   Do not apply liquid medicine, cream medicine, ointment medicine, or makeup to your wound while the skin adhesive is in place. This may loosen the film before your wound is healed.   If a dressing is placed over the wound, be careful not  to apply tape directly over the skin adhesive. This may cause the adhesive to be pulled off before the wound is healed.   Avoid prolonged exposure to sunlight or tanning lamps while the skin adhesive is in place.  The skin adhesive will usually remain in place for 5-10 days, then naturally fall off the skin. Do not pick at the adhesive film.  After Healing: Once the wound has healed, cover the wound with sunscreen during the day for 1 full year. This can help minimize scarring. Exposure to ultraviolet light in the first year will darken the scar. It can take 1-2 years for the scar to lose its redness and to heal completely.  SEEK IMMEDIATE MEDICAL CARE IF:  You have redness, pain, or swelling around the wound.   You see ayellowish-white fluid (pus) coming from the wound.   You have chills or a fever.  MAKE SURE YOU:  Understand these instructions.  Will watch your condition.  Will get help right away if you are not doing well or get worse. Document Released: 12/24/2004 Document Revised: 09/06/2013 Document Reviewed: 06/29/2013 [facility] Patient Information 2015 [place], [place]. This information is not intended to replace advice given to you by your health care provider. Make sure you discuss any questions you have with your health care provider.

## 2015-04-16 NOTE — ED Notes (Signed)
Pt checked for incontinence and pulled up in the bed. Pt restless pulling at things family at the bedside.

## 2015-04-16 NOTE — ED Notes (Signed)
Continues to be restless, additional Ativan IV given at this time.  Old blood around mouth cleaned up with NS.  Family at bedside requesting that PCP Dr. Alphonsus SiasLetvak be notified since he knows his condition.  Information passed on to Dr. Shaune PollackLord.  Explained to family though that Dr. Alphonsus SiasLetvak is not a hospital MD so that there is probably very little he could do.  Dr. Shaune PollackLord will talk to family also.

## 2015-04-16 NOTE — ED Notes (Signed)
C-collar placed.

## 2015-04-16 NOTE — ED Notes (Signed)
Patient transported to back from CT 

## 2015-04-16 NOTE — ED Notes (Signed)
Multiple doses of Ativan need, patient restless, trying to get out of the bed, tugging at clothes. Confused.  C-collar removed at family's request, but explained the necessity of it and of the scans.

## 2015-04-16 NOTE — ED Provider Notes (Addendum)
[facility] Emergency Department Provider Note   ____________________________________________  Time seen: On arrival I have reviewed the triage vital signs and the triage nursing note.  HISTORY  Chief Complaint Fall and Laceration   Historian Limited as patient has dementia History provided by sister with whom the patient lives  HPI Luke Mason is a 59 y.o. male who was found having fallen in the middle of the street. He apparently wandered away from his home and fell from standing. A police officer saw him fall face first into the ground. Patient is not complaining of pain other than when he is touched over his hematomas to the face. However he is poor historian. Per the sister, patient typically does wander around the yard but does not leave the yard itself. He has been fine recently with no recent illness, fever, trouble breathing, vomiting or diarrhea.    Past Medical History  Diagnosis Date  . Unspecified hypothyroidism   . Episodic mood disorder   . Heart murmur   . Dementia, frontotemporal     lobe    Patient Active Problem List   Diagnosis Date Noted  . DOE (dyspnea on exertion) 02/26/2015  . Episodic mood disorder   . Hyperlipidemia 09/26/2013  . Hypothyroidism 09/26/2013  . Frontal lobe dementia 09/26/2013  . Heart murmur 09/26/2013    No past surgical history on file.  Current Outpatient Rx  Name  Route  Sig  Dispense  Refill  . Ascorbic Acid (VITAMIN C) 1000 MG tablet   Oral   Take 1,000 mg by mouth daily.         Marland Kitchen. aspirin 81 MG tablet   Oral   Take 81 mg by mouth daily.         . cholecalciferol (VITAMIN D) 1000 UNITS tablet   Oral   Take 1,000 Units by mouth daily.         . fluPHENAZine (PROLIXIN) 2.5 MG tablet   Oral   Take 2.5 mg by mouth 2 (two) times daily.         Marland Kitchen. levothyroxine (SYNTHROID, LEVOTHROID) 25 MCG tablet   Oral   Take 25 mcg by mouth daily before breakfast.         . Multiple Vitamin  (MULTIVITAMIN) tablet   Oral   Take 1 tablet by mouth daily.         . pravastatin (PRAVACHOL) 20 MG tablet   Oral   Take 1 tablet (20 mg total) by mouth every evening.   90 tablet   3   . sertraline (ZOLOFT) 100 MG tablet   Oral   Take 100 mg by mouth daily.           Allergies Review of patient's allergies indicates no known allergies.  Family History  Problem Relation Age of Onset  . Heart disease Father   . Hypertension Father   . Hyperlipidemia Father   . Heart attack Father     Social History History  Substance Use Topics  . Smoking status: Former Smoker    Types: Cigarettes    Quit date: 09/26/2007  . Smokeless tobacco: Not on file  . Alcohol Use: Yes     Comment: occasional beer.    Review of Systems Per sister Constitutional: Negative for fever. Eyes:  ENT:  Cardiovascular: Negative for chest pain. Respiratory: Negative for shortness of breath. Gastrointestinal: Negative for abdominal pain, vomiting and diarrhea. Genitourinary:  Musculoskeletal:  Skin:  Neurological: Negative for headaches, focal  weakness or numbness.  ____________________________________________   PHYSICAL EXAM:  VITAL SIGNS: ED Triage Vitals  Enc Vitals Group     BP 04/16/15 1832 129/92 mmHg     Pulse Rate 04/16/15 1832 72     Resp 04/16/15 1832 14     Temp 04/16/15 1832 97.3 F (36.3 C)     Temp Source 04/16/15 1832 Oral     SpO2 04/16/15 1828 97 %     Weight 04/16/15 1832 174 lb 8 oz (79.153 kg)     Height 04/16/15 1832 5\' 6"  (1.676 m)     Head Cir --      Peak Flow --      Pain Score --      Pain Loc --      Pain Edu? --      Excl. in GC? --      Constitutional: Alert and cooperative but with dementia. No acute distress Eyes: Conjunctivae are normal. PERRL. Normal extraocular movements. No actual ocular movement traumatic ENT   Head: Normocephalic. 0.5 cm laceration within the right eyebrow with surrounding hematoma and tenderness. Large hematoma  over the zygomatic arch on the right with tenderness to palpation   Nose: No congestion/rhinnorhea.   Mouth/Throat: Mucous membranes are moist. Possible tiny chip of the right upper second incisor. Laceration on the interior of the right upper lip with ecchymosis/hematoma.   Neck: No stridor. No focal step-offs or tenderness on palpation Cardiovascular: Normal rate, regular rhythm.  No murmurs, rubs, or gallops. Respiratory: Normal respiratory effort without tachypnea nor retractions. Breath sounds are clear and equal bilaterally. No wheezes/rales/rhonchi. Abrasion of the right shoulder blade Gastrointestinal: Soft and nontender. No distention.  Genitourinary: Musculoskeletal: Full range of motion of the right shoulder with some abrasion about the right shoulder blade. Neurologic:  Moves 4 extremities with no focal neurologic deficits. Skin:  Abrasion laceration as above Psychiatric: Dementia but cooperative.  ____________________________________________   EKG  71 bpm. Normal sinus rhythm. Normal axis. Narrow QRS. Normal ST and T-wave. ____________________________________________  LABS (pertinent positives/negatives)  Basic metabolic panel without significant abnormality CBC showing white blood cell count normal at 6.4, hemoglobin 11.8 Urinalysis negative  ____________________________________________  RADIOLOGY Radiologist results reviewed  CT head: Generalized atrophy, no acute intracranial abnormalities. Right facial contusion and hematoma CT cervical spine: No acute fracture or subluxation __________________________________________  PROCEDURES  Procedure(s) performed: LACERATION REPAIR Performed by: Governor RooksLORD, Waris Rodger Authorized by: Governor RooksLORD, Kel Senn Consent: Verbal consent obtained. Risks and benefits: risks, benefits and alternatives were discussed Consent given by: patient Patient identity confirmed: provided demographic data  Wound explored  Laceration Location:  Right eyebrow  Laceration Length: 0.5 cm  No Foreign Bodies seen or palpated   Irrigation method: syringe Amount of cleaning: standard  Skin closure: Glue    Patient tolerance: Patient tolerated the procedure well with no immediate complications.  Critical Care performed: No  ____________________________________________   ED COURSE / ASSESSMENT AND PLAN  Pertinent labs & imaging results that were available during my care of the patient were reviewed by me and considered in my medical decision making (see chart for details).  The patient was initially very calm however upon putting a c-collar on the patient became extremely agitated with this due to his baseline dementia. He did not appear to have any C-spine tenderness on exam and the sister does not want him wearing the c-collar and understands the risks of leaving it off prior to imaging. Upon the patient going over to CT scan he was  not holding still to do the CT and was brought back. He was given small doses of Ativan back to back in order to gain some level of, so her to get the CT is completed. His head CT was able to be completed however the C-spine was still unable to be done due to movement. After multiple doses of Ativan, I discussed with the family giving a small dose of Haldol to facilitate evaluation. I also need a urine sample and this will be done by catheter after the patient can tolerate.  CT cervical negative for acute trauma. Urinalysis negative.  After sedative medications patient will be walked to make sure he is safe for going home and observed if he needs any time from this. ___________________________________________   FINAL CLINICAL IMPRESSION(S) / ED DIAGNOSES  Acute hematoma right face due to fall from standing Acute laceration right face due to falling from standing     Governor Rooks, MD 04/16/15 1610  Governor Rooks, MD 04/16/15 [phone]

## 2015-04-16 NOTE — ED Notes (Signed)
UA obtained via in and out catheter. 400cc out.  Patient continues to be restless. Haldol given

## 2015-04-16 NOTE — ED Notes (Signed)
Transported to CT 

## 2015-04-16 NOTE — ED Notes (Signed)
Pt arrived to ED via EMS after fall outside.  Pt has severe dementia and lives at home where his sister takes care of him.  He got outside and started going down the street.  A police officer drove by and saw him and asked if he was ok and he started running down the street and fell face first into the ground.  Patient denies any pain but has multiple lacerations to the right eye brown, lip, and right upper cheek under the right eye. Scrape to right shoulder blade also.

## 2015-04-17 ENCOUNTER — Telehealth: Payer: Self-pay

## 2015-04-17 ENCOUNTER — Encounter: Payer: Self-pay | Admitting: Occupational Medicine

## 2015-04-17 NOTE — Telephone Encounter (Signed)
Luke Mason pts sister said [facility]; later on 04/16/15 pt fell in front of house and was [facility] (note in pts chart from ED). Pt was given med due to restlessness and combative behavior in ED. Pt is very sleepy and Luke Mason does not feel she can [facility] to have TB skin test read on 04/18/15 or 04/19/15 due to fall risk. Luke Mason also wants to know if should give pt his daily medicines due to pt being so sleepy and "out of it". Pt shuffles his feet and is not able to move without assistance; difficult to get him from the bed to the chair to change the bed when pt is incontinent. More difficult to move pt since pt is so sedated. Sherry request cb. From the support group Luke Mason attended Luke Mason wants to know if Dr Alphonsus SiasLetvak would consider doing home visit.

## 2015-04-17 NOTE — Telephone Encounter (Signed)
.  left message to have patient's sister return my call.

## 2015-04-17 NOTE — Telephone Encounter (Signed)
She should hold his meds today. I read about the ER visit so I know they needed to give him sedatives to do the CT scans  I can put him on my home visit list but couldn't get out there probably for at least 3 weeks. I only see my home visit patient about every 2.5-3 months and can't make emergent visits  They need someone (like a Charity fundraiserN) to read that PPD---or it will have to be put back on again

## 2015-04-17 NOTE — Telephone Encounter (Signed)
I have put him on the list--it will probably be about a month before I make it out  A hospital bed with a rail is not a good idea--people usually climb over the rails and fall from a higher height. The best bet is to put his mattress on the floor--this is the safest thing to do.

## 2015-04-17 NOTE — Telephone Encounter (Signed)
Spoke with sister and she would like for you to add pt onto the home visit list. Sister states that home health is coming out tomorrow and she will have one of them call me with the results of the PPD. Also sister is asking if she can get a order for a hospital bed or some kind of bed with rails to keep him from falling out the bed? Please advise

## 2015-04-18 ENCOUNTER — Telehealth: Payer: Self-pay | Admitting: Internal Medicine

## 2015-04-18 LAB — TB SKIN TEST
INDURATION: 0 mm
TB SKIN TEST: NEGATIVE

## 2015-04-18 NOTE — Telephone Encounter (Signed)
Called patients sister and [facility] is helping her out a couple of hours a day right now. I explained that Home Health will not work out for their needs at this time. She is bringing Vale HavenDavey in today to get TB skin test read. Geraldine ContrasDee said she will go out to the car to read it and then print her the paperwork. She says [facility] will [facility] her know today if they will accept him. I am cancelling the Home Health Referral.

## 2015-04-18 NOTE — Telephone Encounter (Signed)
Spoke with sister in person and advised, pt came today for ppd skin test reading

## 2015-04-18 NOTE — Telephone Encounter (Signed)
Spoke with sister today in person and she understands, pt may be moving to [facility]

## 2015-04-18 NOTE — Telephone Encounter (Signed)
Okay  If [facility] goes to [facility] I recommend transferring care to the house physician there Dr Elease HashimotoMaloney

## 2015-05-07 ENCOUNTER — Telehealth: Payer: Self-pay | Admitting: Internal Medicine

## 2015-05-07 NOTE — Telephone Encounter (Signed)
Called sister to discuss home visit He fell a few weeks ago and needed medical attention Then got worse--so [facility] They wound up getting a bed in SNF in [place] and is staying there for now. She is trying to find some place closer if possible

## 2015-07-29 ENCOUNTER — Encounter: Payer: Medicaid Other | Admitting: Internal Medicine

## 2016-02-09 IMAGING — CT CT HEAD W/O CM
1 series · 16 of 30 positions shown, 20 images · non-contrast
Comparison: None

CLINICAL DATA: Fell outside in street while running, severe
dementia, facial trauma, initial encounter

EXAM:
CT HEAD WITHOUT CONTRAST
TECHNIQUE: Contiguous axial images were obtained from the base of the skull
through the vertex without intravenous contrast. Right side of face
marked with BB.

[Series 2: head wo · axial · 0.49mm/px · z∈[-36,+119]mm · 16 of 35 slices shown, 20 images]
[im 2/35  brain]
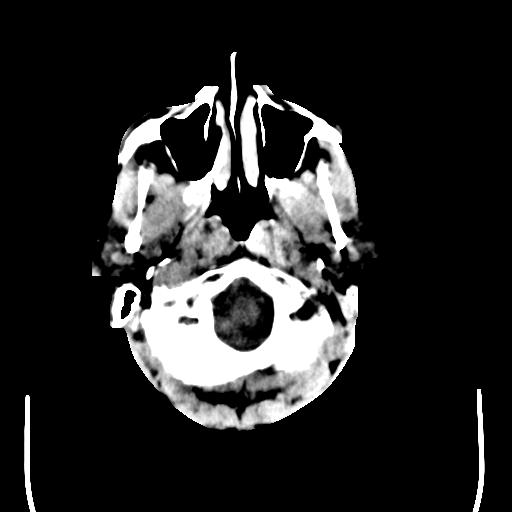
[im 2/35  bone]
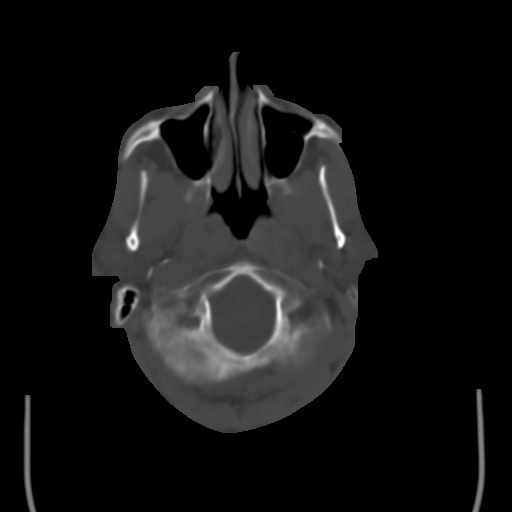
[im 4/35  brain]
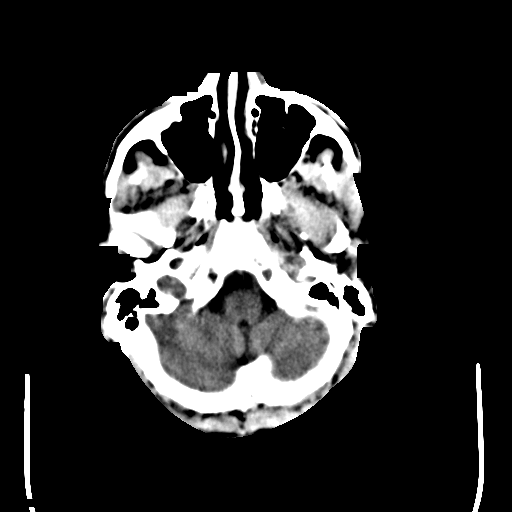
[im 6/35  brain]
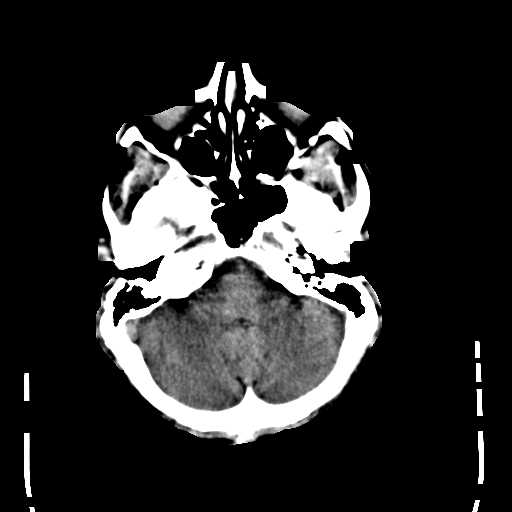
[im 9/35  brain]
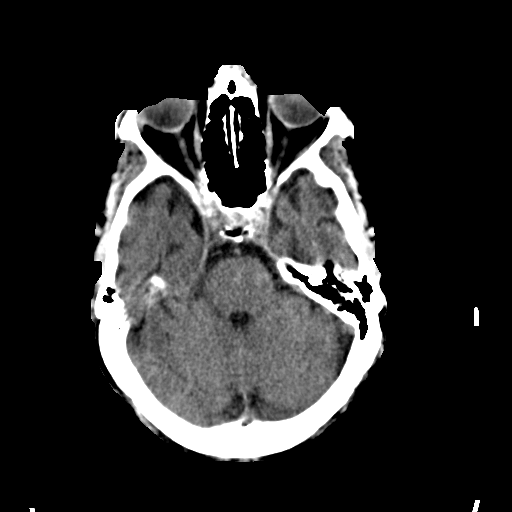
[im 10/35  brain]
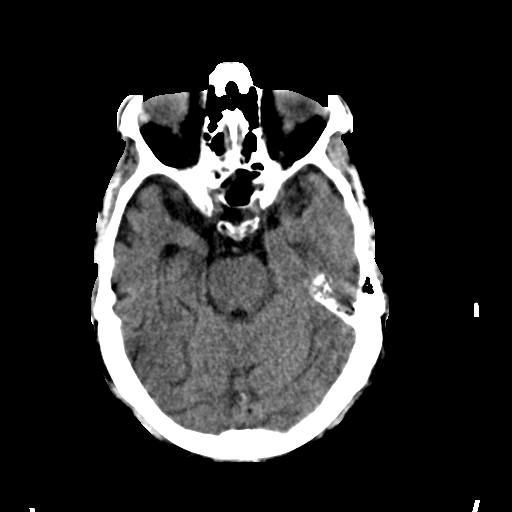
[im 10/35  bone]
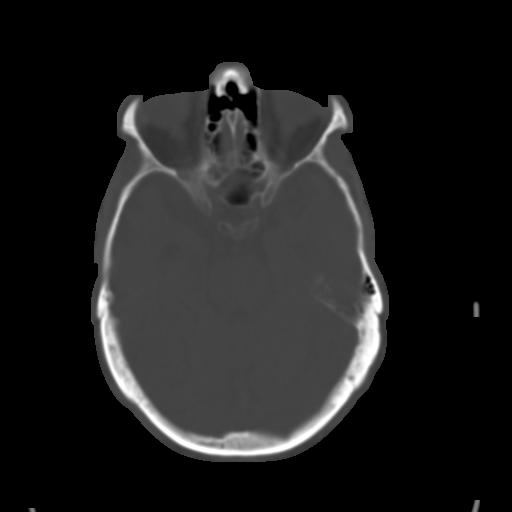
[im 12/35  brain]
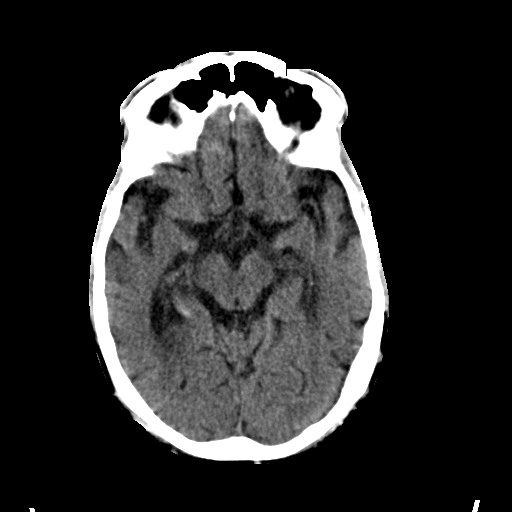
[im 15/35  brain]
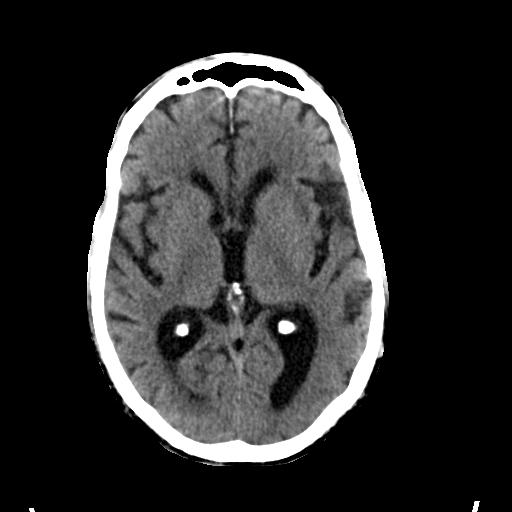
[im 17/35  brain]
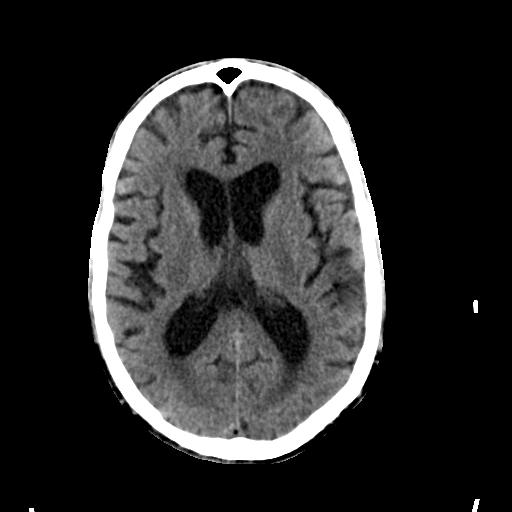
[im 18/35  brain]
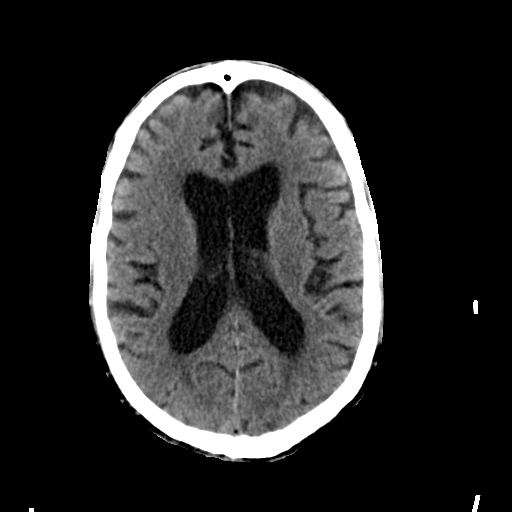
[im 18/35  bone]
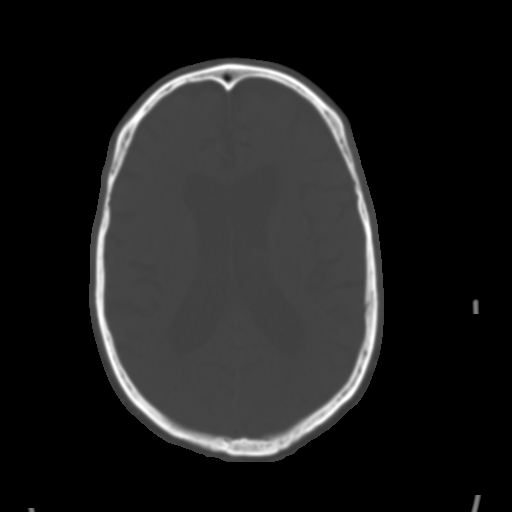
[im 20/35  brain]
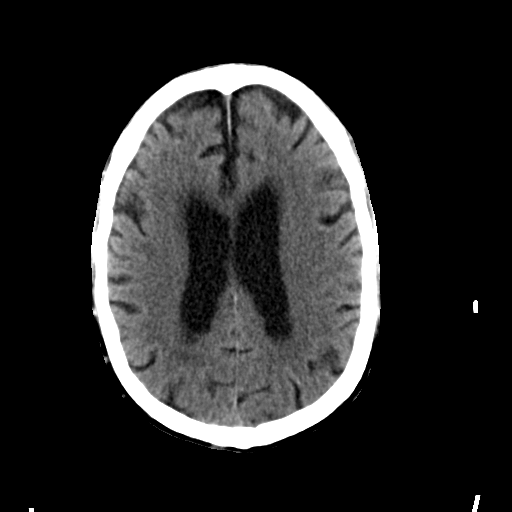
[im 23/35  brain]
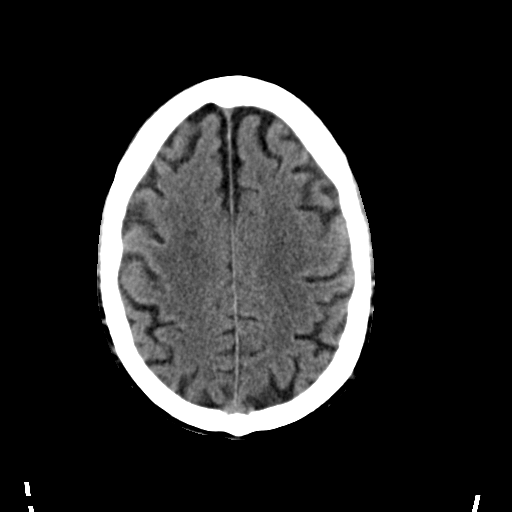
[im 25/35  brain]
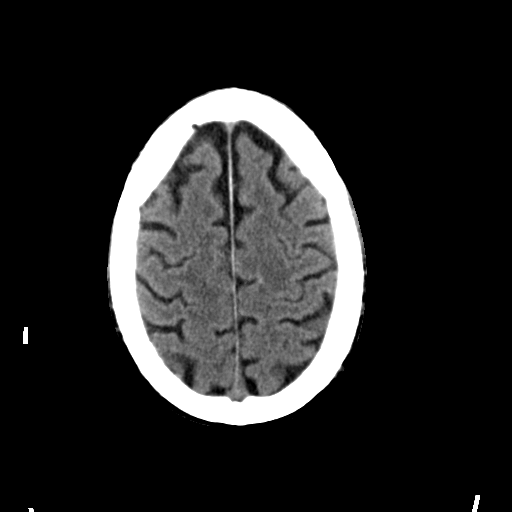
[im 26/35  brain]
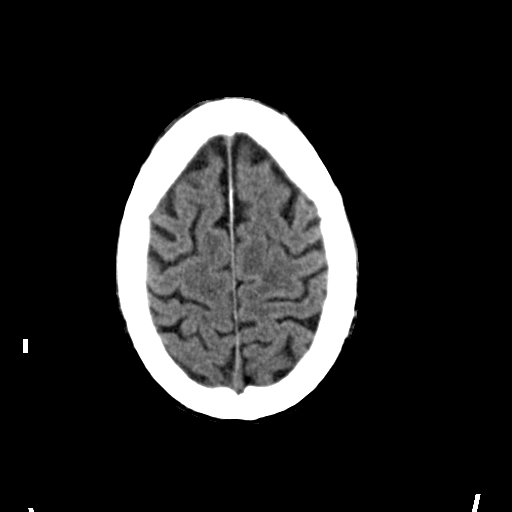
[im 26/35  bone]
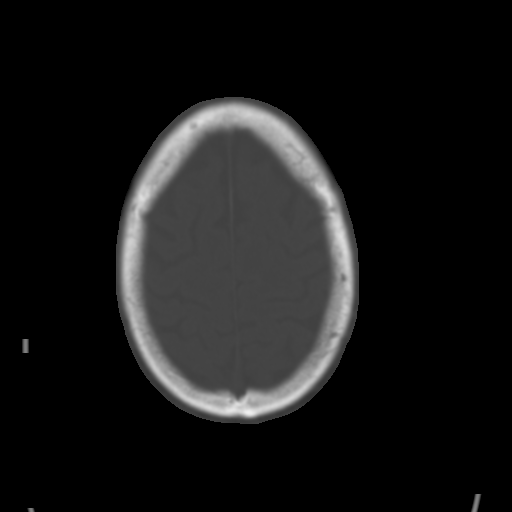
[im 29/35  brain]
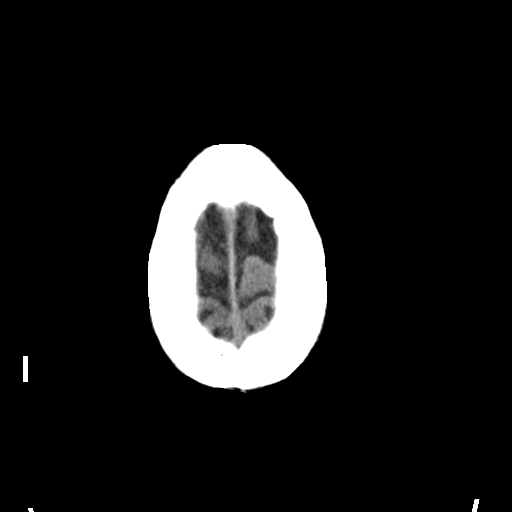
[im 31/35  brain]
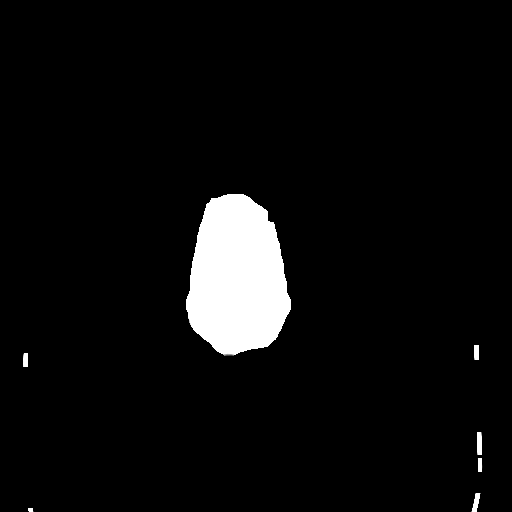
[im 33/35  brain]
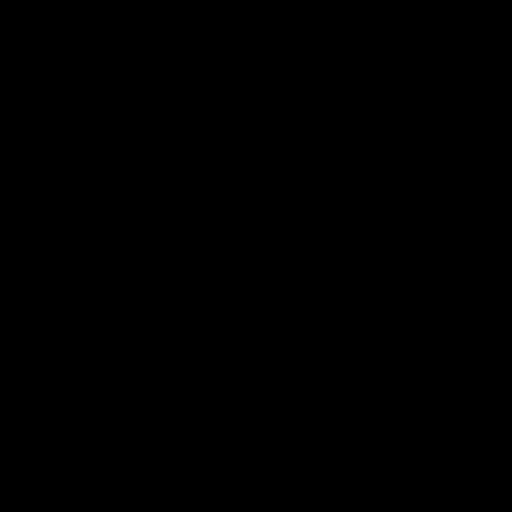

[16 of 30 positions shown; findings below may reference images not displayed]

FINDINGS: Generalized atrophy.

Normal ventricular morphology.

No midline shift or mass effect.

Otherwise normal appearance of brain parenchyma.

No intracranial hemorrhage, mass lesion, or evidence acute
infarction.

No extra-axial fluid collections.

RIGHT periorbital and infraorbital contusion/hematoma.

Visualized paranasal sinuses and mastoid air cells clear.

Skull intact.
IMPRESSION: Generalized atrophy.

No acute intracranial abnormalities.

RIGHT facial contusion/hematoma.

## 2016-02-09 IMAGING — CT CT CERVICAL SPINE W/O CM
4 series · 16 of 33 positions shown, 19 images · non-contrast
Comparison: None.

CLINICAL DATA: Follow-up head injury. Patient with dementia, cannot
clinically clear cervical spine. Fall face first onto the ground,
multiple lacerations to the face.

EXAM:
CT CERVICAL SPINE WITHOUT CONTRAST
TECHNIQUE: Multidetector CT imaging of the cervical spine was performed without
intravenous contrast. Multiplanar CT image reconstructions were also
generated.

[Series 4: c spine soft · axial · 0.29mm/px · z∈[-262,-198]mm · 3 of 96 slices shown]
[im 16/96  soft-tissue]
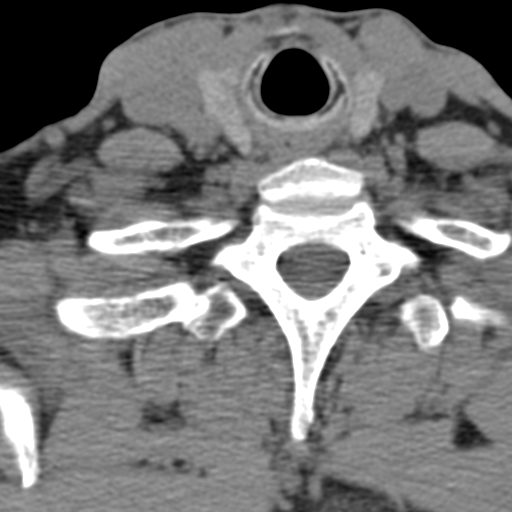
[im 32/96  soft-tissue]
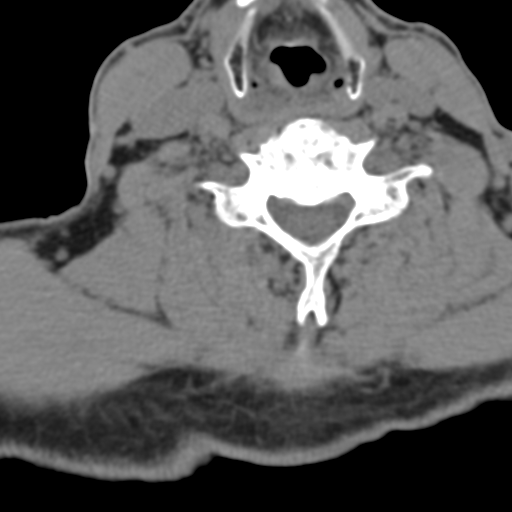
[im 48/96  soft-tissue]
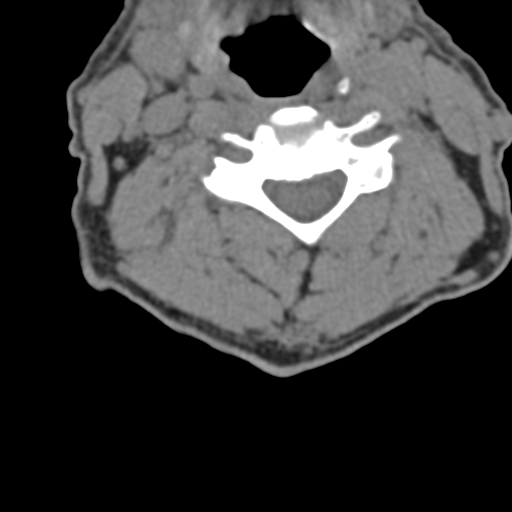

[Series 5: sag bone · sagittal · 0.40mm/px · 5 of 45 slices shown, 6 images]
[im 15/45  bone]
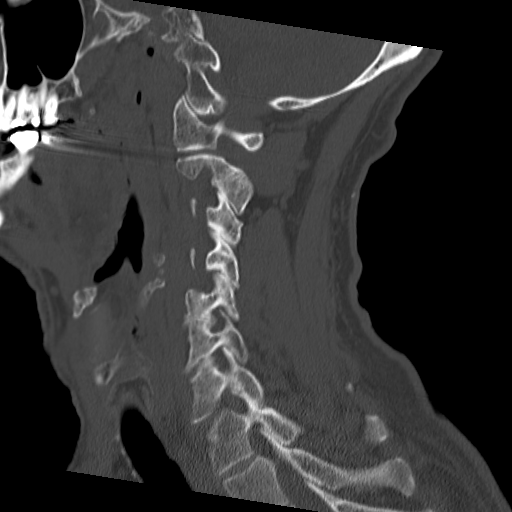
[im 19/45  bone]
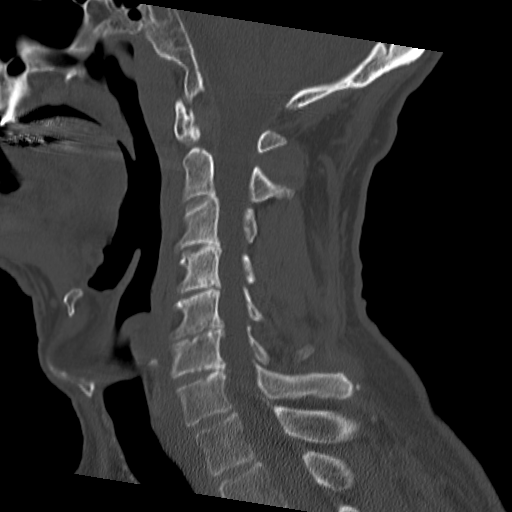
[im 23/45  soft-tissue]
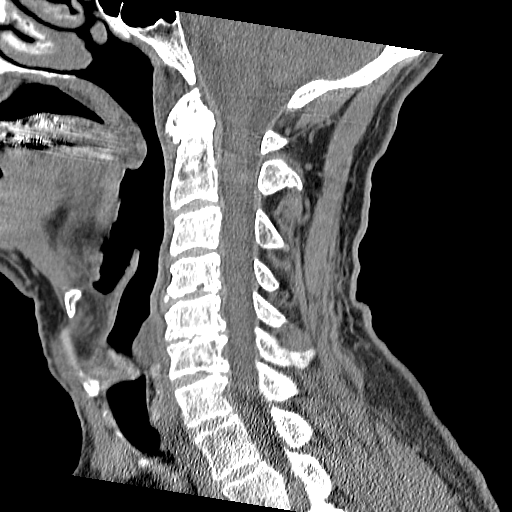
[im 23/45  bone]
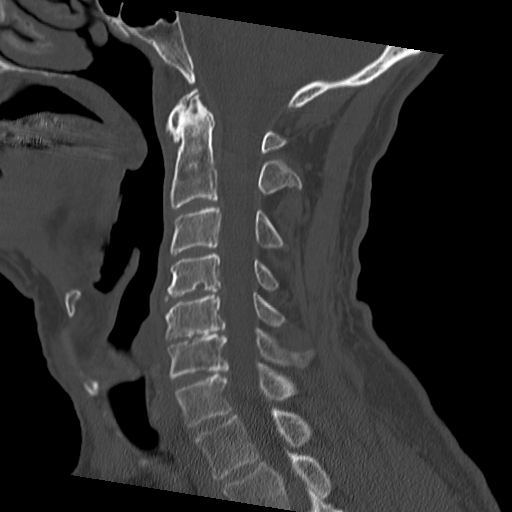
[im 26/45  bone]
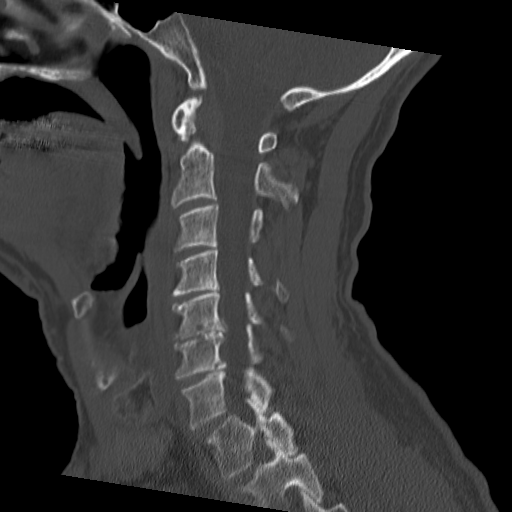
[im 30/45  bone]
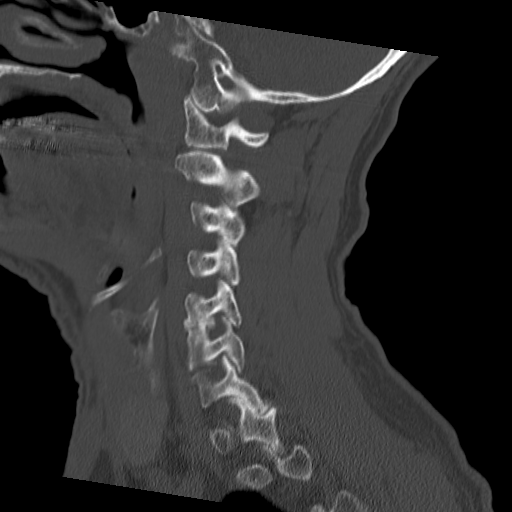

[Series 6: cor bone · coronal · 0.40mm/px · 3 of 51 slices shown]
[im 11/51  bone]
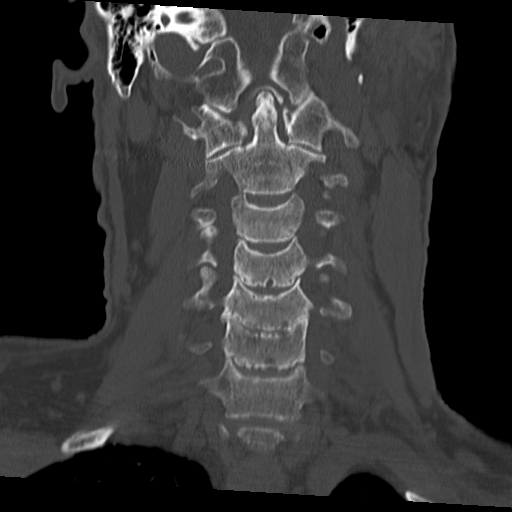
[im 21/51  bone]
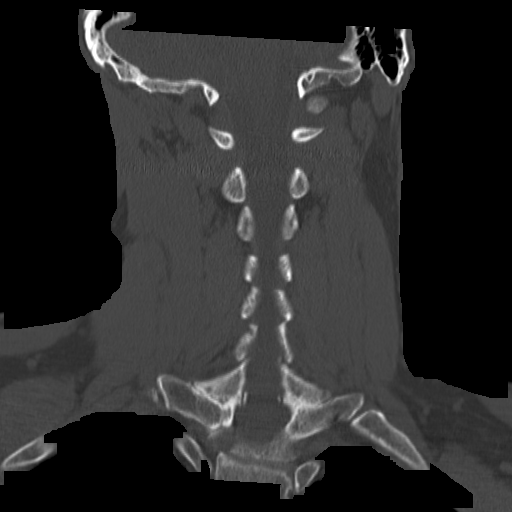
[im 31/51  bone]
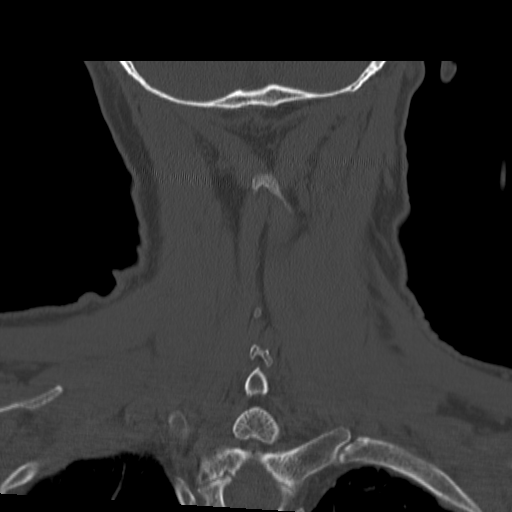

[Series 7: orthogonal axials · axial · 0.29mm/px · z∈[-281,-156]mm · 5 of 98 slices shown, 7 images]
[im 17/98  soft-tissue]
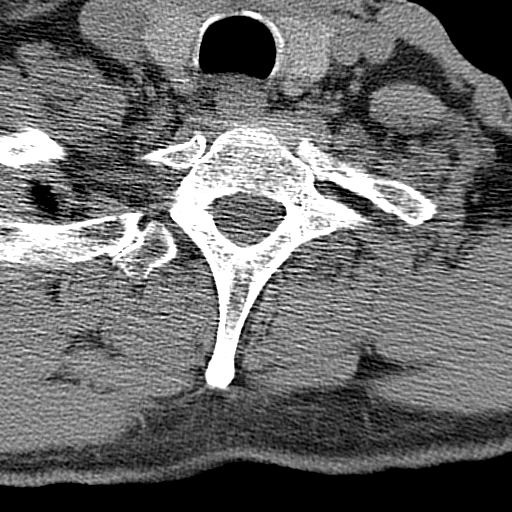
[im 17/98  bone]
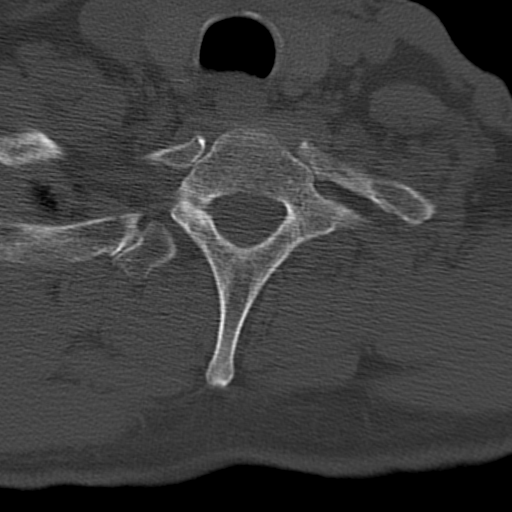
[im 33/98  bone]
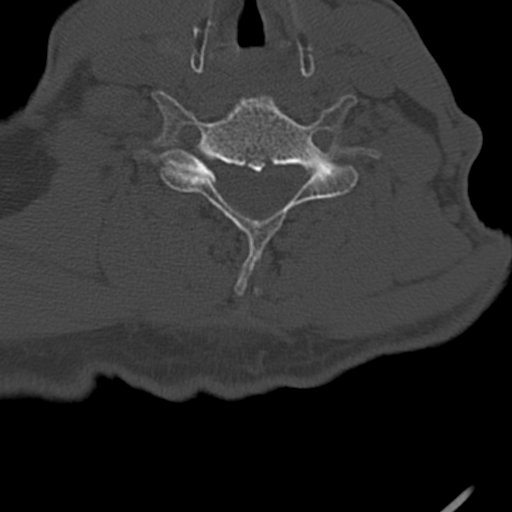
[im 49/98  bone]
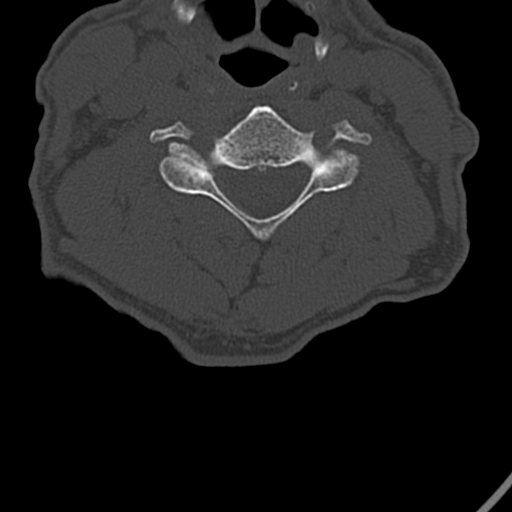
[im 65/98  bone]
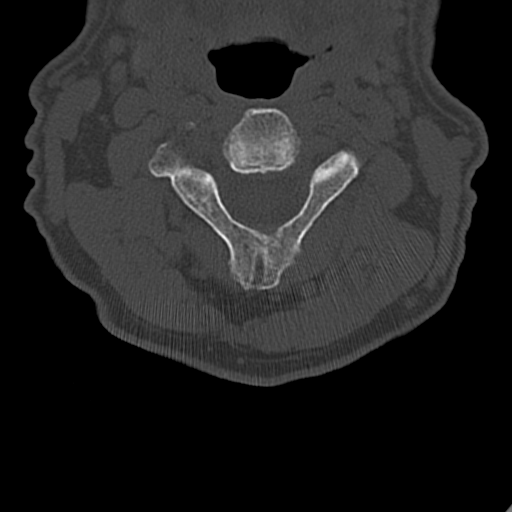
[im 81/98  soft-tissue]
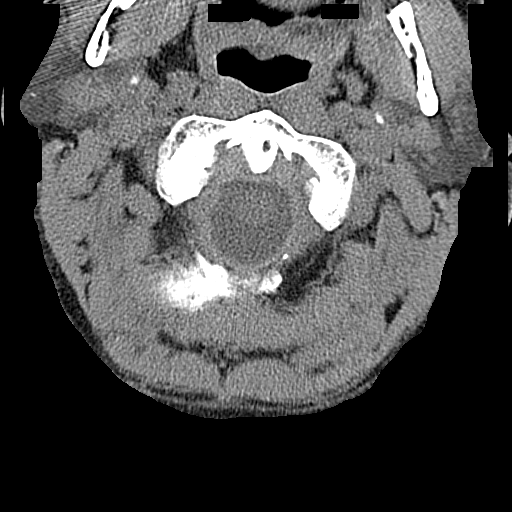
[im 81/98  bone]
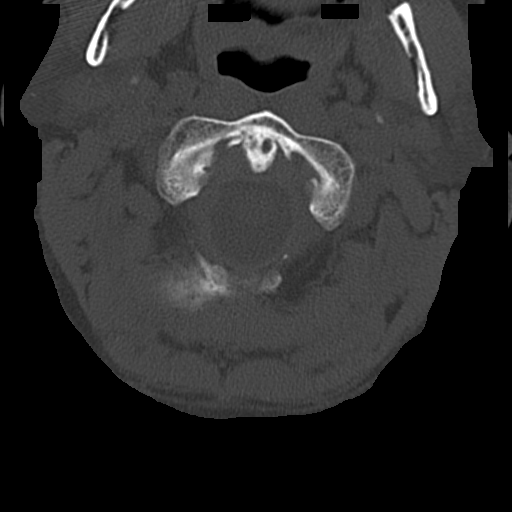

[16 of 33 positions shown; findings below may reference images not displayed]

FINDINGS: Cervical spine alignment is maintained. Vertebral body heights are
preserved. There is no fracture. The dens is intact. There are no
jumped or perched facets. Disc space narrowing from C4-C5 through
C6-C7 with associated endplate spurs. There is mild multilevel
uncovertebral hypertrophic change. No prevertebral soft tissue
edema. Pleural parenchymal scarring noted at the lung apices.
IMPRESSION: Degenerative change in the cervical spine without acute fracture or
subluxation.

## 2017-02-28 DEATH — deceased
